# Patient Record
Sex: Female | Born: 1995 | Race: Black or African American | Hispanic: No | Marital: Single | Smoking: Never smoker
Health system: Southern US, Community
[De-identification: ages and names within clinical notes are randomized; demographics above are authoritative.]

## PROBLEM LIST (undated history)

## (undated) ENCOUNTER — Inpatient Hospital Stay (HOSPITAL_COMMUNITY): Payer: Self-pay

## (undated) DIAGNOSIS — Z789 Other specified health status: Secondary | ICD-10-CM

## (undated) HISTORY — PX: NO PAST SURGERIES: SHX2092

---
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
## 2017-06-23 ENCOUNTER — Emergency Department (HOSPITAL_COMMUNITY): Payer: Medicaid Other

## 2017-06-23 ENCOUNTER — Encounter (HOSPITAL_COMMUNITY): Payer: Self-pay | Admitting: Emergency Medicine

## 2017-06-23 ENCOUNTER — Emergency Department (HOSPITAL_COMMUNITY)
Admission: EM | Admit: 2017-06-23 | Discharge: 2017-06-23 | Disposition: A | Discharge status: Home / Self Care | Payer: Medicaid Other | Attending: Emergency Medicine | Admitting: Emergency Medicine

## 2017-06-23 DIAGNOSIS — S99921A Unspecified injury of right foot, initial encounter: Secondary | ICD-10-CM | POA: Diagnosis present

## 2017-06-23 DIAGNOSIS — Y999 Unspecified external cause status: Secondary | ICD-10-CM | POA: Diagnosis not present

## 2017-06-23 DIAGNOSIS — W25XXXA Contact with sharp glass, initial encounter: Secondary | ICD-10-CM | POA: Insufficient documentation

## 2017-06-23 DIAGNOSIS — Y929 Unspecified place or not applicable: Secondary | ICD-10-CM | POA: Insufficient documentation

## 2017-06-23 DIAGNOSIS — Y9301 Activity, walking, marching and hiking: Secondary | ICD-10-CM | POA: Diagnosis not present

## 2017-06-23 DIAGNOSIS — M795 Residual foreign body in soft tissue: Secondary | ICD-10-CM

## 2017-06-23 MED ORDER — LIDOCAINE-EPINEPHRINE (PF) 2 %-1:200000 IJ SOLN
20.0000 mL | Freq: Once | INTRAMUSCULAR | Status: AC
  Administered 2017-06-23: 20 mL
  Filled 2017-06-23: qty 20

## 2017-06-23 MED ORDER — AMOXICILLIN-POT CLAVULANATE 875-125 MG PO TABS: 1.0000 | ORAL_TABLET | Freq: Two times a day (BID) | ORAL | 0 refills | Status: DC

## 2017-06-23 MED ORDER — TETANUS-DIPHTH-ACELL PERTUSSIS 5-2.5-18.5 LF-MCG/0.5 IM SUSP
0.5000 mL | Freq: Once | INTRAMUSCULAR | Status: AC
  Administered 2017-06-23: 0.5 mL via INTRAMUSCULAR
  Filled 2017-06-23: qty 0.5

## 2017-06-23 NOTE — ED Triage Notes (Signed)
Happy meal given to PT with a drink

## 2017-06-23 NOTE — Discharge Instructions (Signed)
Take antibiotics as prescribed. Complete the entire course of antibiotics. You may use Tylenol or ibuprofen as needed for pain. Keep the area covered for 24 hours. After this, you may remove the dressing, wash gently with soap and water, and reapply dressing. Make sure that the cut stays covered and clean until it is all healed. You will have less pain if you wear shoes with thicker soles. Return to the emergency department if you develop fever, chills, purulent drainage, or any new or worsening symptoms.

## 2017-06-23 NOTE — ED Triage Notes (Signed)
Pt reports she stepped on some glass with her right foot this am, unsure where glass came from, unsure of last tetanus shot

## 2017-06-23 NOTE — ED Notes (Signed)
Declined W/C at D/C and was escorted to lobby by RN. 

## 2017-06-23 NOTE — ED Provider Notes (Signed)
[facility]-EMER[facility]ENCY DEPT Provider Note   CSN: [id number] Arrival date & time: 06/23/17  1100     History   Chief Complaint Chief Complaint  Patient presents with  . Foreign Body in Skin    HPI Lisa Davies is a 21 y.o. female presenting with right foot pain.  Patient states that she was walking barefoot around 7 or 8:00 this morning when she stepped on a piece of glass. She tried to remove the glass herself, but was unsuccessful. She reports pain at the base of her right pinky toe, where the glass was inserted, but no pain elsewhere. Touching the area makes the pain worse, nothing makes it better. She is not immunocompromised. She has not taken anything for pain. She has no numbness or tingling. She is not on blood thinners. She does not know when her last tetanus shot was. She has never had a bad reaction to antibiotics, and has not taken any recently.  HPI  History reviewed. No pertinent past medical history.  There are no active problems to display for this patient.   History reviewed. No pertinent surgical history.  OB History    No data available       Home Medications    Prior to Admission medications   Medication Sig Start Date End Date Taking? Authorizing Provider  amoxicillin-clavulanate (AUGMENTIN) 875-125 MG tablet Take 1 tablet by mouth every 12 (twelve) hours. 06/23/17   Davelyn Gwinn, PA-C    Family History No family history on file.  Social History Social History  Substance Use Topics  . Smoking status: Not on file  . Smokeless tobacco: Not on file  . Alcohol use Not on file     Allergies   Patient has no known allergies.   Review of Systems Review of Systems  Skin: Positive for wound.  Neurological: Negative for numbness.  Hematological: Does not bruise/bleed easily.     Physical Exam Updated Vital Signs BP 114/79   Pulse 88   Temp 97.9 F (36.6 C) (Oral)   Resp 12   SpO2 99%   Physical Exam  Constitutional: She is oriented  to person, place, and time. She appears well-developed and well-nourished. No distress.  HENT:  Head: Normocephalic and atraumatic.  Eyes: EOM are normal.  Neck: Normal range of motion.  Pulmonary/Chest: Effort normal.  Abdominal: She exhibits no distension.  Musculoskeletal: Normal range of motion.  Neurological: She is alert and oriented to person, place, and time. She has normal strength. No sensory deficit.  Skin: Skin is warm. Laceration noted.  Laceration at the base of the right pinky toe. Patient with full active range of motion of toes and foot without pain. Sensation intact. No bleeding or drainage from the site. Tenderness to palpation at the base of the pinky toe over the laceration. No lesions noted elsewhere.  Psychiatric: She has a normal mood and affect.  Nursing note and vitals reviewed.    ED Treatments / Results  Labs (all labs ordered are listed, but only abnormal results are displayed) Labs Reviewed - No data to display  EKG  EKG Interpretation None       Radiology Dg Foot Complete Right  Result Date: 06/23/2017 CLINICAL DATA:  Pt c/o plantar right foot pain after stepping on broken glass today. Pt states the glass is still stuck in her foot. No hx of prior injuries or surgeries to the area. EXAM: RIGHT FOOT COMPLETE - 3+ VIEW COMPARISON:  None. FINDINGS: There is no acute fracture  or subluxation. Along the plantar aspect of the fifth metatarsophalangeal joint, there is a 4 x 3 mm radiopaque foreign body compatible with a shard of glass. No soft tissue gas. IMPRESSION: Suspect radiopaque foreign body along the plantar aspect of the fifth metatarsophalangeal joint. Electronically Signed   By: Norva Pavlov M.D.   On: 06/23/2017 14:15    Procedures .Foreign Body Removal Date/Time: 06/23/2017 2:28 PM Performed by: Alveria Apley Authorized by: Alveria Apley  Consent: Verbal consent obtained. Consent given by: patient Intake: Foot. Anesthesia: local  infiltration  Anesthesia: Local Anesthetic: lidocaine 2% with epinephrine Anesthetic total: 6 mL  Sedation: Patient sedated: no Patient restrained: no Complexity: simple 1 objects recovered. Objects recovered: 2 mm x 3 mm piece of glass Post-procedure assessment: foreign body removed Patient tolerance: Patient tolerated the procedure well with no immediate complications Comments: Pt consented to removal of foreign body. Xray obtained for location. Infiltration with 2% lidocaine with epi. 11 blade scalpel used to make a 1 cm incision perpendicular to the piece of glass. Glass removed with forceps. Wound irrigated extensively with Betadine and saline through syringe. Dry sterile dressing applied. Patient tolerated well without medications.   (including critical care time)  Medications Ordered in ED Medications  Tdap (BOOSTRIX) injection 0.5 mL (0.5 mLs Intramuscular Given 06/23/17 1317)  lidocaine-EPINEPHrine (XYLOCAINE W/EPI) 2 %-1:200000 (PF) injection 20 mL (20 mLs Infiltration Given 06/23/17 1327)     Initial Impression / Assessment and Plan / ED Course  I have reviewed the triage vital signs and the nursing notes.  Pertinent labs & imaging results that were available during my care of the patient were reviewed by me and considered in my medical decision making (see chart for details).     Pt presenting with R foot pain where the pt stepped on glass this morning. She was unable to remove it on her own. Xray shows glass at the base of the R pinky toe. Area numbed and glass removed. Wound left open. Pt neurovascularly intact, and able to move toes without difficulty after procedure. Aftercare instructions given. Abx given for open wound. Return precautions given. Pt states she understands and agrees to plan.  Final Clinical Impressions(s) / ED Diagnoses   Final diagnoses:  Foreign body (FB) in soft tissue    New Prescriptions Discharge Medication List as of 06/23/2017  2:22 PM      START taking these medications   Details  amoxicillin-clavulanate (AUGMENTIN) 875-125 MG tablet Take 1 tablet by mouth every 12 (twelve) hours., Starting Sun 06/23/2017, Print         Meranda Dechaine, PA-C 06/23/17 1610    Rolland Porter, MD 07/01/17 [phone]

## 2017-08-08 ENCOUNTER — Inpatient Hospital Stay (HOSPITAL_COMMUNITY): Payer: Medicaid Other

## 2017-08-08 ENCOUNTER — Encounter (HOSPITAL_COMMUNITY): Payer: Self-pay | Admitting: *Deleted

## 2017-08-08 ENCOUNTER — Inpatient Hospital Stay (HOSPITAL_COMMUNITY)
Admission: AD | Admit: 2017-08-08 | Discharge: 2017-08-08 | Disposition: A | Discharge status: Home / Self Care | Payer: Medicaid Other | Source: Ambulatory Visit | Attending: Obstetrics and Gynecology | Admitting: Obstetrics and Gynecology

## 2017-08-08 DIAGNOSIS — O26851 Spotting complicating pregnancy, first trimester: Secondary | ICD-10-CM

## 2017-08-08 DIAGNOSIS — O26891 Other specified pregnancy related conditions, first trimester: Secondary | ICD-10-CM | POA: Insufficient documentation

## 2017-08-08 DIAGNOSIS — R1030 Lower abdominal pain, unspecified: Secondary | ICD-10-CM | POA: Diagnosis present

## 2017-08-08 DIAGNOSIS — O3680X0 Pregnancy with inconclusive fetal viability, not applicable or unspecified: Secondary | ICD-10-CM | POA: Diagnosis not present

## 2017-08-08 DIAGNOSIS — O418X90 Other specified disorders of amniotic fluid and membranes, unspecified trimester, not applicable or unspecified: Secondary | ICD-10-CM | POA: Diagnosis present

## 2017-08-08 DIAGNOSIS — Z3A08 8 weeks gestation of pregnancy: Secondary | ICD-10-CM | POA: Insufficient documentation

## 2017-08-08 DIAGNOSIS — O418X10 Other specified disorders of amniotic fluid and membranes, first trimester, not applicable or unspecified: Secondary | ICD-10-CM

## 2017-08-08 DIAGNOSIS — O208 Other hemorrhage in early pregnancy: Secondary | ICD-10-CM | POA: Diagnosis not present

## 2017-08-08 DIAGNOSIS — O26859 Spotting complicating pregnancy, unspecified trimester: Secondary | ICD-10-CM

## 2017-08-08 DIAGNOSIS — O468X1 Other antepartum hemorrhage, first trimester: Secondary | ICD-10-CM

## 2017-08-08 DIAGNOSIS — O468X9 Other antepartum hemorrhage, unspecified trimester: Secondary | ICD-10-CM

## 2017-08-08 HISTORY — DX: Other specified health status: Z78.9

## 2017-08-08 LAB — WET PREP, GENITAL
CLUE CELLS WET PREP: NONE SEEN
Sperm: NONE SEEN
TRICH WET PREP: NONE SEEN
YEAST WET PREP: NONE SEEN

## 2017-08-08 LAB — ABO/RH: ABO/RH(D): A POS

## 2017-08-08 LAB — URINALYSIS, ROUTINE W REFLEX MICROSCOPIC
BILIRUBIN URINE: NEGATIVE
Glucose, UA: NEGATIVE mg/dL
Hgb urine dipstick: NEGATIVE
KETONES UR: NEGATIVE mg/dL
Nitrite: NEGATIVE
PH: 6 (ref 5.0–8.0)
Protein, ur: NEGATIVE mg/dL
SPECIFIC GRAVITY, URINE: 1.023 (ref 1.005–1.030)

## 2017-08-08 LAB — CBC
HCT: 37.8 % (ref 36.0–46.0)
HEMOGLOBIN: 12.6 g/dL (ref 12.0–15.0)
MCH: 29.7 pg (ref 26.0–34.0)
MCHC: 33.3 g/dL (ref 30.0–36.0)
MCV: 89.2 fL (ref 78.0–100.0)
PLATELETS: 274 10*3/uL (ref 150–400)
RBC: 4.24 MIL/uL (ref 3.87–5.11)
RDW: 13.5 % (ref 11.5–15.5)
WBC: 4.6 10*3/uL (ref 4.0–10.5)

## 2017-08-08 LAB — POCT PREGNANCY, URINE: Preg Test, Ur: POSITIVE — AB

## 2017-08-08 LAB — HCG, QUANTITATIVE, PREGNANCY: HCG, BETA CHAIN, QUANT, S: 20657 m[IU]/mL — AB (ref <5)

## 2017-08-08 MED ORDER — PRENATAL COMPLETE 14-0.4 MG PO TABS: 1.0000 | ORAL_TABLET | Freq: Every day | ORAL | 12 refills | Status: AC

## 2017-08-08 NOTE — MAU Note (Signed)
Pt reports she had a positive home preg test, started having spotting and lower abd pain 4 days ago.

## 2017-08-08 NOTE — Discharge Instructions (Signed)
Return to Maternity Admissions Unit if you start bleeding like a period and/or pain that is not relieved with Tylenol.

## 2017-08-08 NOTE — MAU Provider Note (Signed)
History     CSN: [id number]  Arrival date and time: 08/08/17 [id number]   First Provider Initiated Contact with Patient 08/08/17 1042      Chief Complaint  Patient presents with  . Possible Pregnancy  . Abdominal Pain  . Vaginal Bleeding   HPI  [name] is a 21 y.o. G3P2 at 4874w1d gestation presenting to MAU with complaints of (+) HPT, spotting and lower abd pains x 4 days. She says the pain is more in the lower RT pelvis. She denies N/V/D  Past Medical History:  Diagnosis Date  . Medical history non-contributory     Past Surgical History:  Procedure Laterality Date  . NO PAST SURGERIES      History reviewed. No pertinent family history.  Social History   Tobacco Use  . Smoking status: Never Smoker  . Smokeless tobacco: Never Used  Substance Use Topics  . Alcohol use: No    Frequency: Never  . Drug use: No    Allergies: No Known Allergies  Medications Prior to Admission  Medication Sig Dispense Refill Last Dose  . amoxicillin-clavulanate (AUGMENTIN) 875-125 MG tablet Take 1 tablet by mouth every 12 (twelve) hours. (Patient not taking: Reported on 08/08/2017) 14 tablet 0 Not Taking at Unknown time    Review of Systems Physical Exam   Blood pressure 109/67, pulse 91, temperature 98.4 F (36.9 C), temperature source Oral, resp. rate 16, height 5\' 6"  (1.676 m), weight 126 lb (57.2 kg), last menstrual period 06/12/2017, SpO2 100 %.  Physical Exam  MAU Course  Procedures  MDM CCUA UPT CBC HCG ABO/Rh Wet Prep  GC/CT OB U/S < 14 wks OB Transvaginal U/S  Results for orders placed or performed during the hospital encounter of 08/08/17 (from the past 24 hour(s))  Urinalysis, Routine w reflex microscopic     Status: Abnormal   Collection Time: 08/08/17  9:57 AM  Result Value Ref Range   Color, Urine YELLOW YELLOW   APPearance HAZY (A) CLEAR   Specific Gravity, Urine 1.023 1.005 - 1.030   pH 6.0 5.0 - 8.0   Glucose, UA NEGATIVE NEGATIVE mg/dL   Hgb urine dipstick NEGATIVE NEGATIVE   Bilirubin Urine NEGATIVE NEGATIVE   Ketones, ur NEGATIVE NEGATIVE mg/dL   Protein, ur NEGATIVE NEGATIVE mg/dL   Nitrite NEGATIVE NEGATIVE   Leukocytes, UA TRACE (A) NEGATIVE   RBC / HPF 0-5 0 - 5 RBC/hpf   WBC, UA 0-5 0 - 5 WBC/hpf   Bacteria, UA RARE (A) NONE SEEN   Squamous Epithelial / LPF 0-5 (A) NONE SEEN   Mucus PRESENT   Pregnancy, urine POC     Status: Abnormal   Collection Time: 08/08/17 10:23 AM  Result Value Ref Range   Preg Test, Ur POSITIVE (A) NEGATIVE  CBC     Status: None   Collection Time: 08/08/17 10:42 AM  Result Value Ref Range   WBC 4.6 4.0 - 10.5 K/uL   RBC 4.24 3.87 - 5.11 MIL/uL   Hemoglobin 12.6 12.0 - 15.0 g/dL   HCT 74.237.8 59.536.0 - 63.846.0 %   MCV 89.2 78.0 - 100.0 fL   MCH 29.7 26.0 - 34.0 pg   MCHC 33.3 30.0 - 36.0 g/dL   RDW 75.613.5 43.311.5 - 29.515.5 %   Platelets 274 150 - 400 K/uL  hCG, quantitative, pregnancy     Status: Abnormal   Collection Time: 08/08/17 10:42 AM  Result Value Ref Range   hCG, Beta Chain, Quant, S 20,657 (H) <  5 mIU/mL  ABO/Rh     Status: None (Preliminary result)   Collection Time: 08/08/17 10:42 AM  Result Value Ref Range   ABO/RH(D) A POS   Wet prep, genital     Status: Abnormal   Collection Time: 08/08/17 10:59 AM  Result Value Ref Range   Yeast Wet Prep HPF POC NONE SEEN NONE SEEN   Trich, Wet Prep NONE SEEN NONE SEEN   Clue Cells Wet Prep HPF POC NONE SEEN NONE SEEN   WBC, Wet Prep HPF POC MANY (A) NONE SEEN   Sperm NONE SEEN    [place] Ob Comp Less 14 Wks  Result Date: 08/08/2017 CLINICAL DATA:  Pregnant patient with bleeding and lower abdominal pain for multiple days. EXAM: OBSTETRIC <14 WK US AND TRANSVAGINAL OB US TECHNIQUE: Both transabdominal and transvaginal ultrasound examinations were performed for complete evaluation of the gestation as well as the maternal uterus, adnexal regions, and pelvic cul-de-sac. Transvaginal technique was performed to assess early pregnancy. COMPARISON:  None.  FINDINGS: Intrauterine gestational sac: Single Yolk sac:  Visualized. Embryo:  Not Visualized. Cardiac Activity: Not Visualized. MSD: 11.0  mm   5 w   6  d Subchorionic hemorrhage:  Small Maternal uterus/adnexae: Normal right and left ovaries. Trace fluid in the pelvis. IMPRESSION: Probable early intrauterine gestational sac and yolk sac but no fetal pole or cardiac activity yet visualized. Recommend follow-up quantitative B-HCG levels and follow-up US in 14 days to assess viability. This recommendation follows SRU consensus guidelines: Diagnostic Criteria for Nonviable Pregnancy Early in the First Trimester. Malva Limes Engl J Med 2013; 147:8295-62; 369:1443-51. Electronically Signed   By: Annia Beltrew  Davis M.D.   On: 08/08/2017 13:46   [place] Ob Transvaginal  Result Date: 08/08/2017 CLINICAL DATA:  Pregnant patient with bleeding and lower abdominal pain for multiple days. EXAM: OBSTETRIC <14 WK US AND TRANSVAGINAL OB US TECHNIQUE: Both transabdominal and transvaginal ultrasound examinations were performed for complete evaluation of the gestation as well as the maternal uterus, adnexal regions, and pelvic cul-de-sac. Transvaginal technique was performed to assess early pregnancy. COMPARISON:  None. FINDINGS: Intrauterine gestational sac: Single Yolk sac:  Visualized. Embryo:  Not Visualized. Cardiac Activity: Not Visualized. MSD: 11.0  mm   5 w   6  d Subchorionic hemorrhage:  Small Maternal uterus/adnexae: Normal right and left ovaries. Trace fluid in the pelvis. IMPRESSION: Probable early intrauterine gestational sac and yolk sac but no fetal pole or cardiac activity yet visualized. Recommend follow-up quantitative B-HCG levels and follow-up US in 14 days to assess viability. This recommendation follows SRU consensus guidelines: Diagnostic Criteria for Nonviable Pregnancy Early in the First Trimester. Malva Limes Engl J Med 2013; 130:8657-84; 369:1443-51. Electronically Signed   By: Annia Beltrew  Davis M.D.   On: 08/08/2017 13:46     Assessment and Plan  Pregnancy  with uncertain fetal viability, single or unspecified fetus - - - Plan Viability U/S in 2 wks - F/U with [facility] after U/S  Subchorionic hematoma in first trimester, single or unspecified fetus - [facility] - Advised to return to MAU for BRB and/or abd pain not relieved by Tylenol  Discharge home Patient verbalized an understanding of the plan of care and agrees.    Raelyn Moraolitta Cattleya Dobratz 08/08/2017, 10:42 AM

## 2017-08-09 LAB — GC/CHLAMYDIA PROBE AMP (~~LOC~~) NOT AT ARMC
CHLAMYDIA, DNA PROBE: NEGATIVE
NEISSERIA GONORRHEA: NEGATIVE

## 2017-08-09 LAB — HIV ANTIBODY (ROUTINE TESTING W REFLEX): HIV Screen 4th Generation wRfx: NONREACTIVE

## 2017-08-20 ENCOUNTER — Encounter (HOSPITAL_COMMUNITY): Payer: Self-pay | Admitting: *Deleted

## 2017-08-20 ENCOUNTER — Inpatient Hospital Stay (HOSPITAL_COMMUNITY)
Admission: AD | Admit: 2017-08-20 | Discharge: 2017-08-20 | Disposition: A | Discharge status: Home / Self Care | Payer: Medicaid Other | Source: Ambulatory Visit | Attending: Obstetrics & Gynecology | Admitting: Obstetrics & Gynecology

## 2017-08-20 ENCOUNTER — Inpatient Hospital Stay (HOSPITAL_COMMUNITY): Payer: Medicaid Other

## 2017-08-20 ENCOUNTER — Other Ambulatory Visit: Payer: Self-pay

## 2017-08-20 DIAGNOSIS — R109 Unspecified abdominal pain: Secondary | ICD-10-CM

## 2017-08-20 DIAGNOSIS — O26891 Other specified pregnancy related conditions, first trimester: Secondary | ICD-10-CM | POA: Insufficient documentation

## 2017-08-20 DIAGNOSIS — O418X10 Other specified disorders of amniotic fluid and membranes, first trimester, not applicable or unspecified: Secondary | ICD-10-CM | POA: Diagnosis not present

## 2017-08-20 DIAGNOSIS — O468X1 Other antepartum hemorrhage, first trimester: Secondary | ICD-10-CM | POA: Diagnosis not present

## 2017-08-20 DIAGNOSIS — M549 Dorsalgia, unspecified: Secondary | ICD-10-CM | POA: Insufficient documentation

## 2017-08-20 DIAGNOSIS — R1032 Left lower quadrant pain: Secondary | ICD-10-CM | POA: Insufficient documentation

## 2017-08-20 DIAGNOSIS — O219 Vomiting of pregnancy, unspecified: Secondary | ICD-10-CM | POA: Diagnosis not present

## 2017-08-20 DIAGNOSIS — Z3A01 Less than 8 weeks gestation of pregnancy: Secondary | ICD-10-CM | POA: Diagnosis not present

## 2017-08-20 DIAGNOSIS — Z79899 Other long term (current) drug therapy: Secondary | ICD-10-CM | POA: Diagnosis not present

## 2017-08-20 DIAGNOSIS — O212 Late vomiting of pregnancy: Secondary | ICD-10-CM | POA: Diagnosis not present

## 2017-08-20 DIAGNOSIS — M7918 Myalgia, other site: Secondary | ICD-10-CM | POA: Diagnosis not present

## 2017-08-20 DIAGNOSIS — Z3491 Encounter for supervision of normal pregnancy, unspecified, first trimester: Secondary | ICD-10-CM

## 2017-08-20 LAB — URINALYSIS, ROUTINE W REFLEX MICROSCOPIC
BACTERIA UA: NONE SEEN
Bilirubin Urine: NEGATIVE
Glucose, UA: NEGATIVE mg/dL
Hgb urine dipstick: NEGATIVE
KETONES UR: 20 mg/dL — AB
Leukocytes, UA: NEGATIVE
Nitrite: NEGATIVE
PROTEIN: 100 mg/dL — AB
Specific Gravity, Urine: 1.031 — ABNORMAL HIGH (ref 1.005–1.030)
pH: 5 (ref 5.0–8.0)

## 2017-08-20 MED ORDER — PROMETHAZINE HCL 25 MG PO TABS: 12.5000 mg | ORAL_TABLET | Freq: Every day | ORAL | 2 refills | Status: AC

## 2017-08-20 MED ORDER — METOCLOPRAMIDE HCL 10 MG PO TABS: 10.0000 mg | ORAL_TABLET | Freq: Three times a day (TID) | ORAL | 2 refills | Status: AC

## 2017-08-20 NOTE — MAU Provider Note (Signed)
Chief Complaint: Abdominal Pain and Back Pain   First Provider Initiated Contact with Patient 08/20/17 1148      SUBJECTIVE HPI: Lisa Davies is a 21 y.o. G3P2 at 1027w4d by gestational sac diameter on US 08/08/17, who presents to maternity admissions reporting LLQ abdominal pain that started today. It is sharp, intermittent, increased with walking/position change, and resolved with rest.  She has nausea with occasional vomiting which is unchanged by today's pain.  There are no other associated symptoms. She has not tried any treatments.  Her US on 08/08/17 confirmed an IUP at 392w6d but there was not yet a fetal pole and she had a small subchorionic hemorrhage. She denies bleeding today. She has outpatient US ordered on 08/22/17.  She denies vaginal itching/burning, urinary symptoms, h/a, dizziness, or fever/chills.     HPI  Past Medical History:  Diagnosis Date  . Medical history non-contributory    Past Surgical History:  Procedure Laterality Date  . NO PAST SURGERIES     Social History   Socioeconomic History  . Marital status: Single    Spouse name: Not on file  . Number of children: Not on file  . Years of education: Not on file  . Highest education level: Not on file  Social Needs  . Financial resource strain: Not on file  . Food insecurity - worry: Not on file  . Food insecurity - inability: Not on file  . Transportation needs - medical: Not on file  . Transportation needs - non-medical: Not on file  Occupational History  . Not on file  Tobacco Use  . Smoking status: Never Smoker  . Smokeless tobacco: Never Used  Substance and Sexual Activity  . Alcohol use: No    Frequency: Never  . Drug use: No  . Sexual activity: Yes  Other Topics Concern  . Not on file  Social History Narrative  . Not on file   No current facility-administered medications on file prior to encounter.    Current Outpatient Medications on File Prior to Encounter  Medication Sig Dispense  Refill  . Prenatal Vit-Fe Fumarate-FA (PRENATAL COMPLETE) 14-0.4 MG TABS Take 1 tablet daily by mouth. 30 each 12   No Known Allergies  ROS:  Review of Systems  Constitutional: Negative for chills, fatigue and fever.  Respiratory: Negative for shortness of breath.   Cardiovascular: Negative for chest pain.  Gastrointestinal: Positive for abdominal pain, nausea and vomiting.  Genitourinary: Positive for pelvic pain. Negative for difficulty urinating, dysuria, flank pain, vaginal bleeding, vaginal discharge and vaginal pain.  Musculoskeletal: Negative for back pain.  Neurological: Negative for dizziness and headaches.  Psychiatric/Behavioral: Negative.      I have reviewed patient's Past Medical Hx, Surgical Hx, Family Hx, Social Hx, medications and allergies.   Physical Exam   Patient Vitals for the past 24 hrs:  BP Temp Temp src Pulse Resp SpO2 Weight  08/20/17 1120 116/70 98.4 F (36.9 C) Oral 83 16 100 % 125 lb 4 oz (56.8 kg)   Constitutional: Well-developed, well-nourished female in no acute distress.  Cardiovascular: normal rate Respiratory: normal effort GI: Abd soft, non-tender. Pos BS x 4 MS: Extremities nontender, no edema, normal ROM Neurologic: Alert and oriented x 4.  GU: Neg CVAT.  PELVIC EXAM: Deferred, done on 11/15    LAB RESULTS Results for orders placed or performed during the hospital encounter of 08/20/17 (from the past 24 hour(s))  Urinalysis, Routine w reflex microscopic     Status: Abnormal  Collection Time: 08/20/17 11:25 AM  Result Value Ref Range   Color, Urine YELLOW YELLOW   APPearance HAZY (A) CLEAR   Specific Gravity, Urine 1.031 (H) 1.005 - 1.030   pH 5.0 5.0 - 8.0   Glucose, UA NEGATIVE NEGATIVE mg/dL   Hgb urine dipstick NEGATIVE NEGATIVE   Bilirubin Urine NEGATIVE NEGATIVE   Ketones, ur 20 (A) NEGATIVE mg/dL   Protein, ur 409 (A) NEGATIVE mg/dL   Nitrite NEGATIVE NEGATIVE   Leukocytes, UA NEGATIVE NEGATIVE   RBC / HPF 0-5 0 - 5  RBC/hpf   WBC, UA 0-5 0 - 5 WBC/hpf   Bacteria, UA NONE SEEN NONE SEEN   Squamous Epithelial / LPF 0-5 (A) NONE SEEN   Mucus PRESENT     --/--/A POS (11/15 1042)  IMAGING US Ob Comp Less 14 Wks  Result Date: 08/08/2017 CLINICAL DATA:  Pregnant patient with bleeding and lower abdominal pain for multiple days. EXAM: OBSTETRIC <14 WK [place] AND TRANSVAGINAL OB US TECHNIQUE: Both transabdominal and transvaginal ultrasound examinations were performed for complete evaluation of the gestation as well as the maternal uterus, adnexal regions, and pelvic cul-de-sac. Transvaginal technique was performed to assess early pregnancy. COMPARISON:  None. FINDINGS: Intrauterine gestational sac: Single Yolk sac:  Visualized. Embryo:  Not Visualized. Cardiac Activity: Not Visualized. MSD: 11.0  mm   5 w   6  d Subchorionic hemorrhage:  Small Maternal uterus/adnexae: Normal right and left ovaries. Trace fluid in the pelvis. IMPRESSION: Probable early intrauterine gestational sac and yolk sac but no fetal pole or cardiac activity yet visualized. Recommend follow-up quantitative B-HCG levels and follow-up US in 14 days to assess viability. This recommendation follows SRU consensus guidelines: Diagnostic Criteria for Nonviable Pregnancy Early in the First Trimester. Malva Limes Med 2013; 811:9147-82. Electronically Signed   By: Annia Belt M.D.   On: 08/08/2017 13:46   US Ob Transvaginal  Result Date: 08/20/2017 CLINICAL DATA:  Abdominal pain during first trimester of pregnancy EXAM: TRANSVAGINAL OB ULTRASOUND TECHNIQUE: Transvaginal ultrasound was performed for complete evaluation of the gestation as well as the maternal uterus, adnexal regions, and pelvic cul-de-sac. COMPARISON:  08/08/2017 FINDINGS: Intrauterine gestational sac: Present, single Yolk sac:  Present Embryo:  Present Cardiac Activity: Present Heart Rate: 141 bpm CRL:   8.7  mm   6 w 5 d                  [place] EDC: 04/10/2018 Subchorionic hemorrhage:  Small  subchronic hemorrhage present Maternal uterus/adnexae: Ovaries and adnexa unremarkable. No free fluid. IMPRESSION: Single live intrauterine gestation as above. Small subchronic hemorrhage. Electronically Signed   By: Ulyses Southward M.D.   On: 08/20/2017 12:45   US Ob Transvaginal  Result Date: 08/08/2017 CLINICAL DATA:  Pregnant patient with bleeding and lower abdominal pain for multiple days. EXAM: OBSTETRIC <14 WK [place] AND TRANSVAGINAL OB US TECHNIQUE: Both transabdominal and transvaginal ultrasound examinations were performed for complete evaluation of the gestation as well as the maternal uterus, adnexal regions, and pelvic cul-de-sac. Transvaginal technique was performed to assess early pregnancy. COMPARISON:  None. FINDINGS: Intrauterine gestational sac: Single Yolk sac:  Visualized. Embryo:  Not Visualized. Cardiac Activity: Not Visualized. MSD: 11.0  mm   5 w   6  d Subchorionic hemorrhage:  Small Maternal uterus/adnexae: Normal right and left ovaries. Trace fluid in the pelvis. IMPRESSION: Probable early intrauterine gestational sac and yolk sac but no fetal pole or cardiac activity yet visualized. Recommend follow-up quantitative B-HCG  levels and follow-up US in 14 days to assess viability. This recommendation follows SRU consensus guidelines: Diagnostic Criteria for Nonviable Pregnancy Early in the First Trimester. Malva Limes Engl J Med 2013; 119:1478-29; 369:1443-51. Electronically Signed   By: Annia Beltrew  Davis M.D.   On: 08/08/2017 13:46    MAU Management/MDM: Ordered labs and reviewed results.  IUP with normal growth since previous US.  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  Rest/ice/heat/warm bath/Tylenol.  Pt with nausea making her unable to eat, feeling weak at work.  Rx for Reglan during the day, Phenergan at bedtime.  Standard pregnancy work restrictions letter given. [facility].  Pt discharged with strict first trimester precautions.  ASSESSMENT 1. Normal IUP  (intrauterine pregnancy) on prenatal ultrasound, first trimester   2. Subchorionic hematoma in first trimester, single or unspecified fetus   3. Abdominal pain during pregnancy in first trimester   4. Musculoskeletal pain   5. Nausea and vomiting during pregnancy prior to [redacted] weeks gestation     PLAN Discharge home  Allergies as of 08/20/2017   No Known Allergies     Medication List    TAKE these medications   metoCLOPramide 10 MG tablet Commonly known as:  REGLAN Take 1 tablet (10 mg total) by mouth 3 (three) times daily before meals.   PRENATAL COMPLETE 14-0.4 MG Tabs Take 1 tablet daily by mouth.   promethazine 25 MG tablet Commonly known as:  PHENERGAN Take 0.5-1 tablets (12.5-25 mg total) by mouth at bedtime.      Follow-up Information    [facility]   Specialty:  Obstetrics and Gynecology Why:  The office will call you with an appointment or call the number listed. Return to MAU as needed for emergencies. Contact information: [address] [place] [phone]          Sharen CounterLisa Leftwich-Kirby Certified Nurse-Midwife 08/20/2017  1:37 PM

## 2017-08-20 NOTE — MAU Note (Signed)
Having bad abd pain.  Hurts when she walks, hurts in her back too.  Started today.

## 2017-08-22 ENCOUNTER — Ambulatory Visit (HOSPITAL_COMMUNITY): Admission: RE | Admit: 2017-08-22 | Payer: Medicaid Other | Source: Ambulatory Visit

## 2017-09-13 ENCOUNTER — Encounter (HOSPITAL_COMMUNITY): Payer: Self-pay | Admitting: *Deleted

## 2017-09-13 ENCOUNTER — Inpatient Hospital Stay (HOSPITAL_COMMUNITY)
Admission: AD | Admit: 2017-09-13 | Discharge: 2017-09-13 | Disposition: A | Discharge status: Home / Self Care | Payer: Medicaid Other | Source: Ambulatory Visit | Attending: Obstetrics & Gynecology | Admitting: Obstetrics & Gynecology

## 2017-09-13 DIAGNOSIS — Z3A10 10 weeks gestation of pregnancy: Secondary | ICD-10-CM | POA: Insufficient documentation

## 2017-09-13 DIAGNOSIS — Z79899 Other long term (current) drug therapy: Secondary | ICD-10-CM | POA: Insufficient documentation

## 2017-09-13 DIAGNOSIS — O209 Hemorrhage in early pregnancy, unspecified: Secondary | ICD-10-CM | POA: Insufficient documentation

## 2017-09-13 DIAGNOSIS — O418X10 Other specified disorders of amniotic fluid and membranes, first trimester, not applicable or unspecified: Secondary | ICD-10-CM

## 2017-09-13 DIAGNOSIS — O468X1 Other antepartum hemorrhage, first trimester: Secondary | ICD-10-CM

## 2017-09-13 DIAGNOSIS — Z3491 Encounter for supervision of normal pregnancy, unspecified, first trimester: Secondary | ICD-10-CM

## 2017-09-13 LAB — URINALYSIS, ROUTINE W REFLEX MICROSCOPIC
BACTERIA UA: NONE SEEN
BILIRUBIN URINE: NEGATIVE
Glucose, UA: NEGATIVE mg/dL
Ketones, ur: NEGATIVE mg/dL
Leukocytes, UA: NEGATIVE
NITRITE: NEGATIVE
PROTEIN: NEGATIVE mg/dL
SPECIFIC GRAVITY, URINE: 1.005 (ref 1.005–1.030)
pH: 7 (ref 5.0–8.0)

## 2017-09-13 NOTE — MAU Provider Note (Signed)
History     CSN: [id number]  Arrival date and time: 09/13/17 2141   First Provider Initiated Contact with Patient 09/13/17 2232      Chief Complaint  Patient presents with  . Vaginal Bleeding   HPI Lisa Davies is a 21 y.o. G3P2002 at 3129w2d who presents with vaginal bleeding. States went to bathroom earlier tonight & saw a large amount of bright red blood in the toilet. Bleeding has not continued. Denies abdominal pain. No recent intercourse. Diagnosed with a subchorionic hemorrhage earlier in the pregnancy.  OB History    Gravida Para Term Preterm AB Living   3 2 2     2    SAB TAB Ectopic Multiple Live Births           2      Past Medical History:  Diagnosis Date  . Medical history non-contributory     Past Surgical History:  Procedure Laterality Date  . NO PAST SURGERIES      History reviewed. No pertinent family history.  Social History   Tobacco Use  . Smoking status: Never Smoker  . Smokeless tobacco: Never Used  Substance Use Topics  . Alcohol use: No    Frequency: Never  . Drug use: No    Allergies: No Known Allergies  Medications Prior to Admission  Medication Sig Dispense Refill Last Dose  . metoCLOPramide (REGLAN) 10 MG tablet Take 1 tablet (10 mg total) by mouth 3 (three) times daily before meals. 90 tablet 2   . Prenatal Vit-Fe Fumarate-FA (PRENATAL COMPLETE) 14-0.4 MG TABS Take 1 tablet daily by mouth. 30 each 12 08/20/2017 at Unknown time  . promethazine (PHENERGAN) 25 MG tablet Take 0.5-1 tablets (12.5-25 mg total) by mouth at bedtime. 30 tablet 2     Review of Systems  Constitutional: Negative.   Gastrointestinal: Negative.   Genitourinary: Positive for vaginal bleeding.   Physical Exam   Blood pressure 119/68, pulse 84, temperature 98.3 F (36.8 C), resp. rate 18, height 5\' 6"  (1.676 m), last menstrual period 06/12/2017.  Physical Exam  Nursing note and vitals reviewed. Constitutional: She is oriented to person, place, and  time. She appears well-developed and well-nourished. No distress.  HENT:  Head: Normocephalic and atraumatic.  Eyes: Conjunctivae are normal. Right eye exhibits no discharge. Left eye exhibits no discharge. No scleral icterus.  Neck: Normal range of motion.  Respiratory: Effort normal. No respiratory distress.  GI: Soft. There is no tenderness.  Genitourinary:  Genitourinary Comments: No blood. Cervix closed.   Neurological: She is alert and oriented to person, place, and time.  Skin: Skin is warm and dry. She is not diaphoretic.  Psychiatric: She has a normal mood and affect. Her behavior is normal. Judgment and thought content normal.    MAU Course  Procedures Results for orders placed or performed during the hospital encounter of 09/13/17 (from the past 24 hour(s))  Urinalysis, Routine w reflex microscopic     Status: Abnormal   Collection Time: 09/13/17 10:20 PM  Result Value Ref Range   Color, Urine STRAW (A) YELLOW   APPearance CLEAR CLEAR   Specific Gravity, Urine 1.005 1.005 - 1.030   pH 7.0 5.0 - 8.0   Glucose, UA NEGATIVE NEGATIVE mg/dL   Hgb urine dipstick MODERATE (A) NEGATIVE   Bilirubin Urine NEGATIVE NEGATIVE   Ketones, ur NEGATIVE NEGATIVE mg/dL   Protein, ur NEGATIVE NEGATIVE mg/dL   Nitrite NEGATIVE NEGATIVE   Leukocytes, UA NEGATIVE NEGATIVE   RBC / HPF  0-5 0 - 5 RBC/hpf   WBC, UA 0-5 0 - 5 WBC/hpf   Bacteria, UA NONE SEEN NONE SEEN   Squamous Epithelial / LPF 0-5 (A) NONE SEEN    MDM A positive Informal bedside ultrasound confirms IUP w/cardiac activity Cervix closed. No blood on exam [facility]  Assessment and Plan  A: 1. Vaginal bleeding in pregnancy, first trimester   2. Subchorionic hematoma in first trimester, single or unspecified fetus   3. Fetal heart tones present, first trimester    P: Discharge home Pelvic rest Bleeding precautions F/u with OB as scheduled  Judeth Hornrin Abner Ardis 09/13/2017, 10:32 PM

## 2017-09-13 NOTE — MAU Note (Signed)
Pt stated when she went to the bathroom earlier today a lot of blood came ut. Does not think any tissue or clots came ut. Denies any pain or cramping at this time and no bleeding now when she went to the BR.

## 2017-09-13 NOTE — Discharge Instructions (Signed)
Subchorionic Hematoma °A subchorionic hematoma is a gathering of blood between the outer wall of the placenta and the inner wall of the womb (uterus). The placenta is the organ that connects the fetus to the wall of the uterus. The placenta performs the feeding, breathing (oxygen to the fetus), and waste removal (excretory work) of the fetus. °Subchorionic hematoma is the most common abnormality found on a result from ultrasonography done during the first trimester or early second trimester of pregnancy. If there has been little or no vaginal bleeding, early small hematomas usually shrink on their own and do not affect your baby or pregnancy. The blood is gradually absorbed over 1-2 weeks. When bleeding starts later in pregnancy or the hematoma is larger or occurs in an older pregnant woman, the outcome may not be as good. Larger hematomas may get bigger, which increases the chances for miscarriage. Subchorionic hematoma also increases the risk of premature detachment of the placenta from the uterus, preterm (premature) labor, and stillbirth. °Follow these instructions at home: °· Stay on bed rest if your health care provider recommends this. Although bed rest will not prevent more bleeding or prevent a miscarriage, your health care provider may recommend bed rest until you are advised otherwise. °· Avoid heavy lifting (more than 10 lb [4.5 kg]), exercise, sexual intercourse, or douching as directed by your health care provider. °· Keep track of the number of pads you use each day and how soaked (saturated) they are. Write down this information. °· Do not use tampons. °· Keep all follow-up appointments as directed by your health care provider. Your health care provider may ask you to have follow-up blood tests or ultrasound tests or both. °Get help right away if: °· You have severe cramps in your stomach, back, abdomen, or pelvis. °· You have a fever. °· You pass large clots or tissue. Save any tissue for your  health care provider to look at. °· Your bleeding increases or you become lightheaded, feel weak, or have fainting episodes. °This information is not intended to replace advice given to you by your health care provider. Make sure you discuss any questions you have with your health care provider. °Document Released: 12/26/2006 Document Revised: 02/16/2016 Document Reviewed: 04/09/2013 °Elsevier Interactive Patient Education © 2017 [employer] ° °

## 2017-10-03 ENCOUNTER — Encounter: Payer: Medicaid Other | Admitting: Certified Nurse Midwife

## 2018-07-03 ENCOUNTER — Encounter (HOSPITAL_COMMUNITY): Payer: Self-pay

## 2018-08-19 IMAGING — CR DG FOOT COMPLETE 3+V*R*
3 series · 3 of 3 positions shown · non-contrast
Comparison: None.

CLINICAL DATA: Pt c/o plantar right foot pain after stepping on
broken glass today. Pt states the glass is still stuck in her foot.
No hx of prior injuries or surgeries to the area.

EXAM:
RIGHT FOOT COMPLETE - 3+ VIEW

[foot ap]
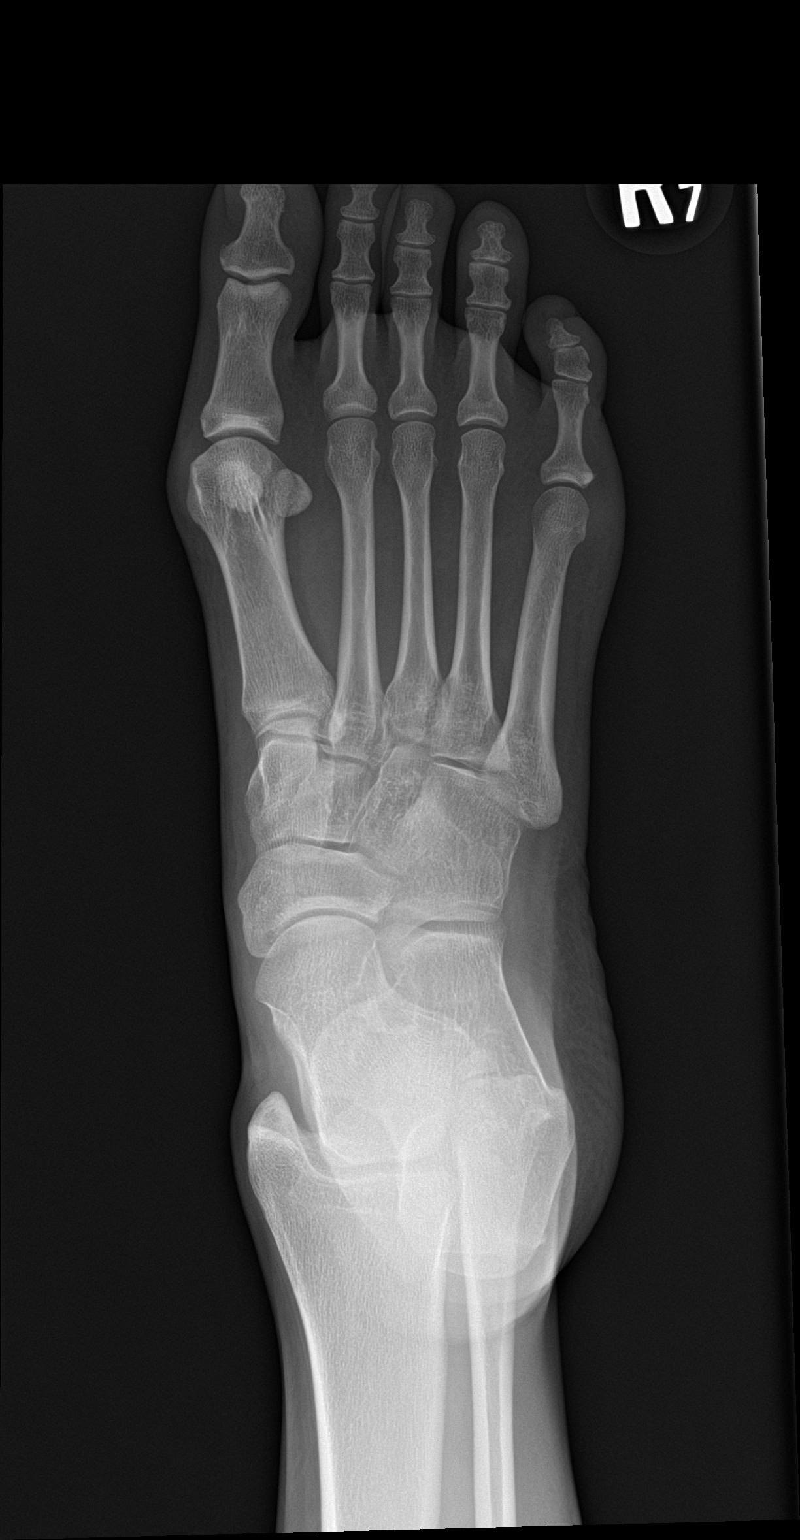

[foot obl]
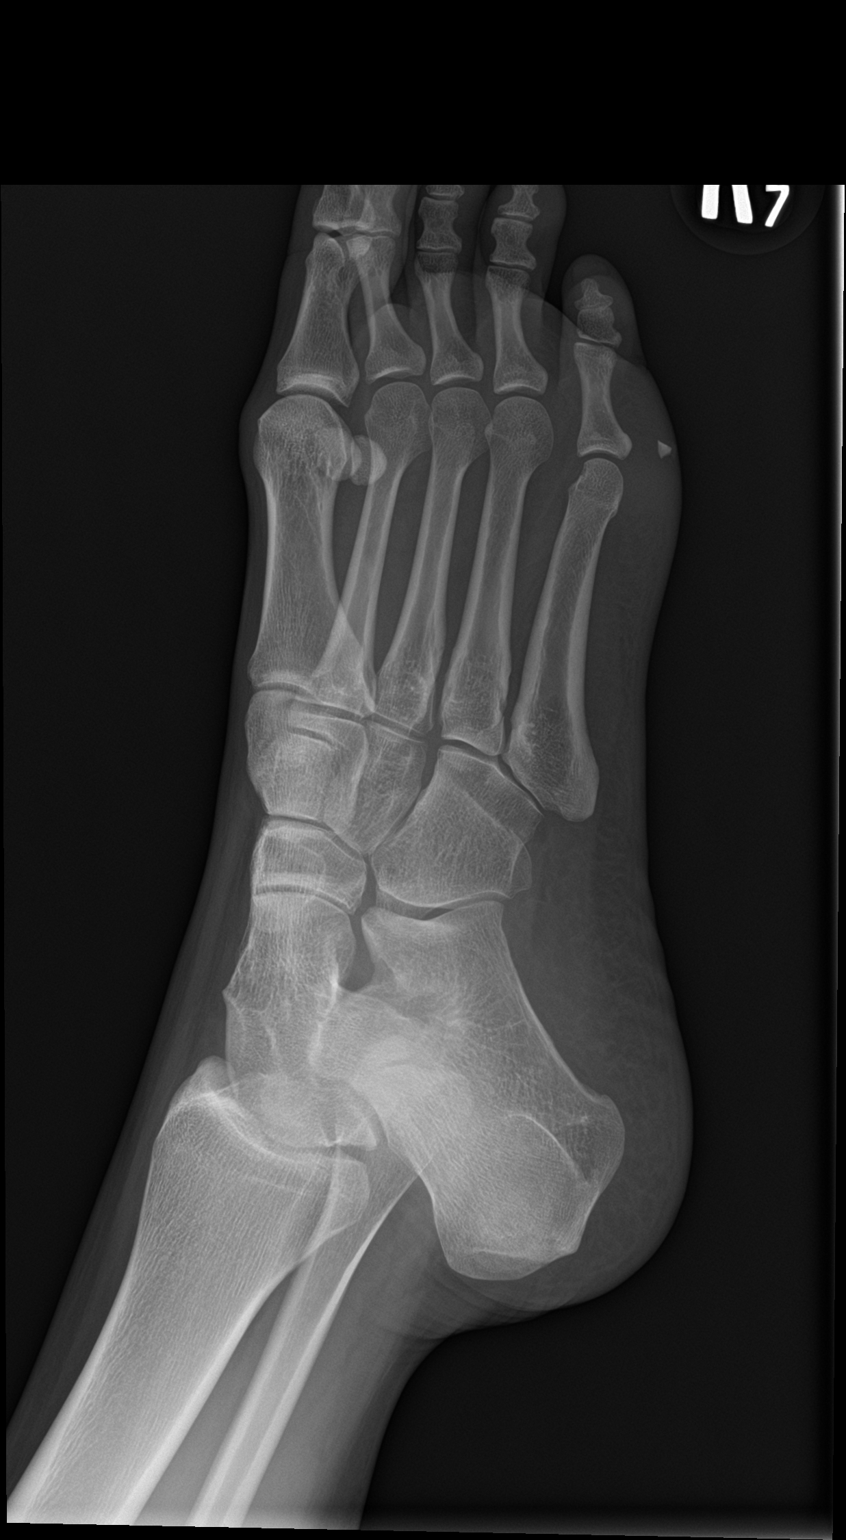

[foot lat]
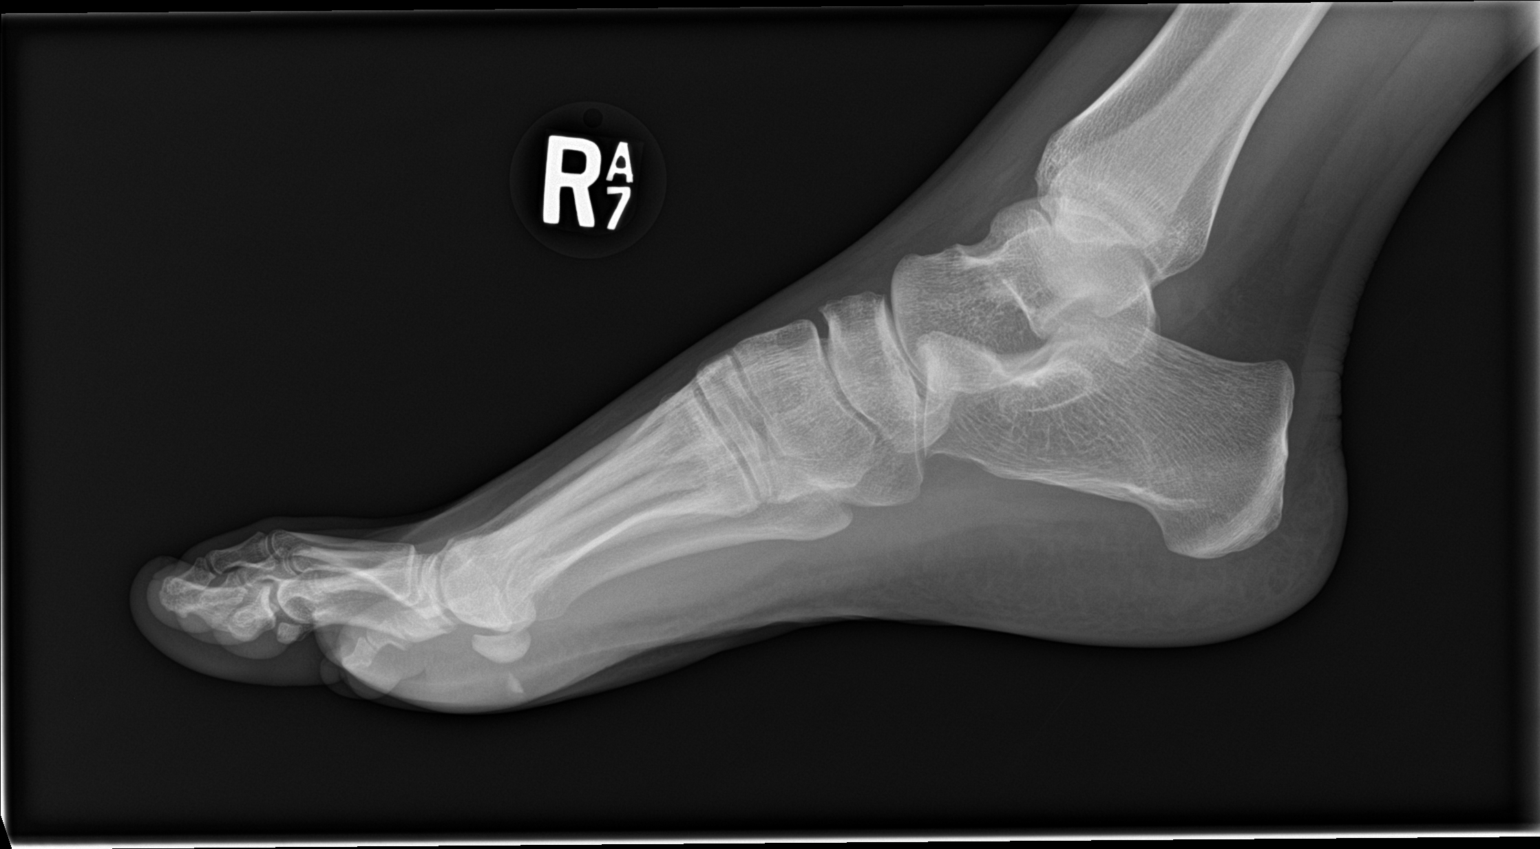

[3 of 3 positions shown; findings below may reference images not displayed]

FINDINGS: There is no acute fracture or subluxation. Along the plantar aspect
of the fifth metatarsophalangeal joint, there is a 4 x 3 mm
radiopaque foreign body compatible with a shard of glass. No soft
tissue gas.
IMPRESSION: Suspect radiopaque foreign body along the plantar aspect of the
fifth metatarsophalangeal joint.

## 2018-10-04 IMAGING — US US OB COMP LESS 14 WK
1 series · 15 of 28 positions shown · non-contrast
Comparison: None.

CLINICAL DATA: Pregnant patient with bleeding and lower abdominal
pain for multiple days.

EXAM:
OBSTETRIC <14 WK US AND TRANSVAGINAL OB US
TECHNIQUE: Both transabdominal and transvaginal ultrasound examinations were
performed for complete evaluation of the gestation as well as the
maternal uterus, adnexal regions, and pelvic cul-de-sac.
Transvaginal technique was performed to assess early pregnancy.

[Series 1: us ob comp less 14 wk · 15 of 65 slices shown]
[im 1/65]
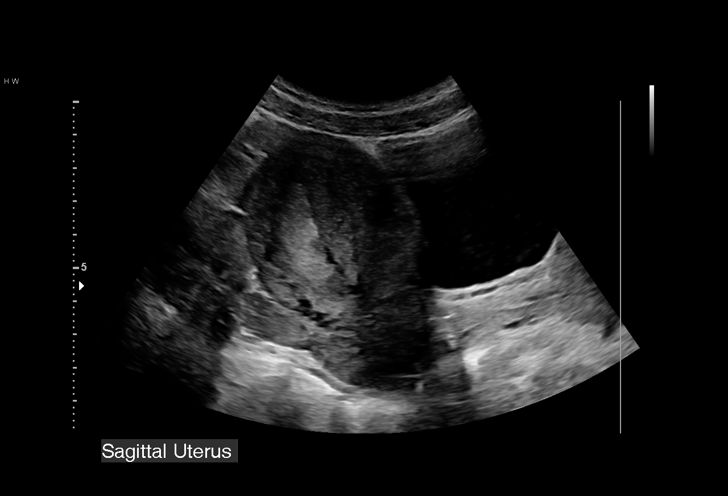
[im 5/65]
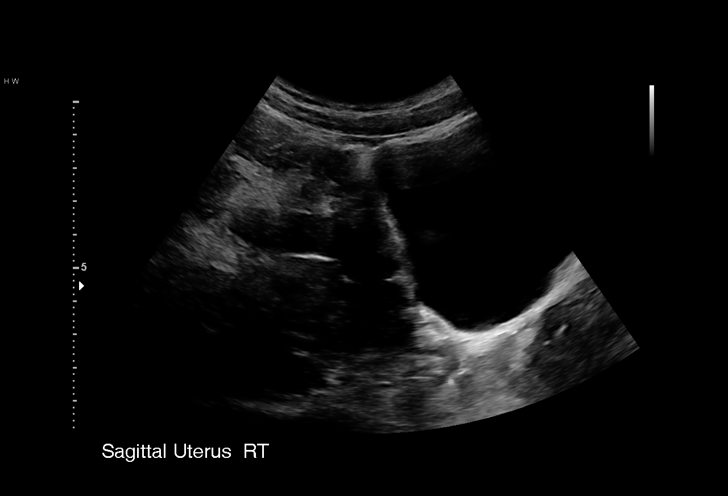
[im 10/65]
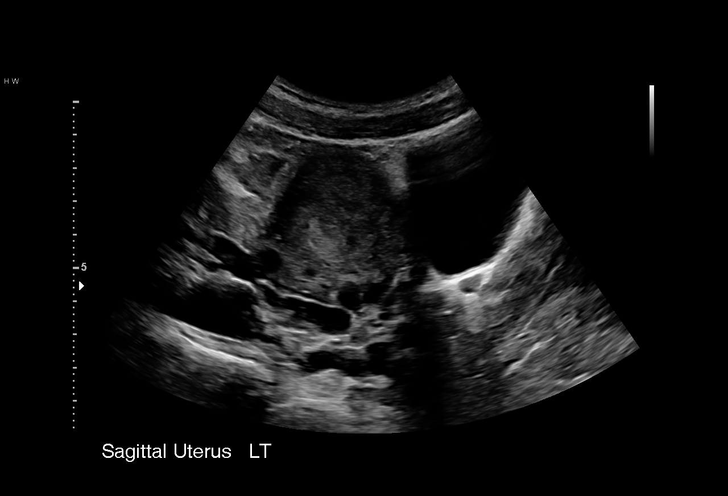
[im 15/65]
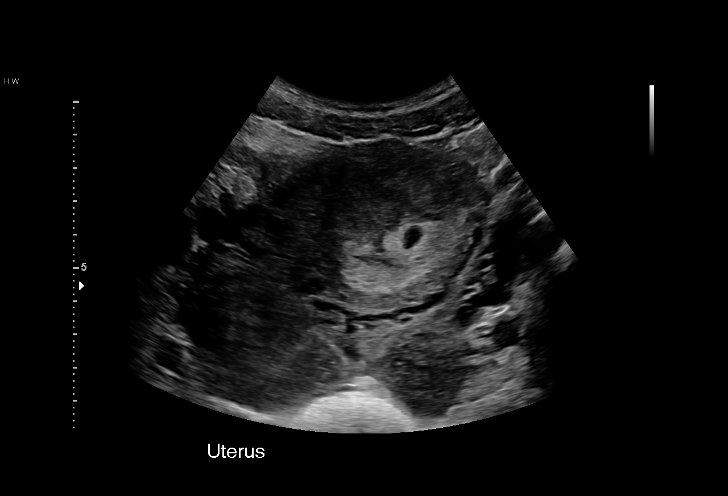
[im 19/65]
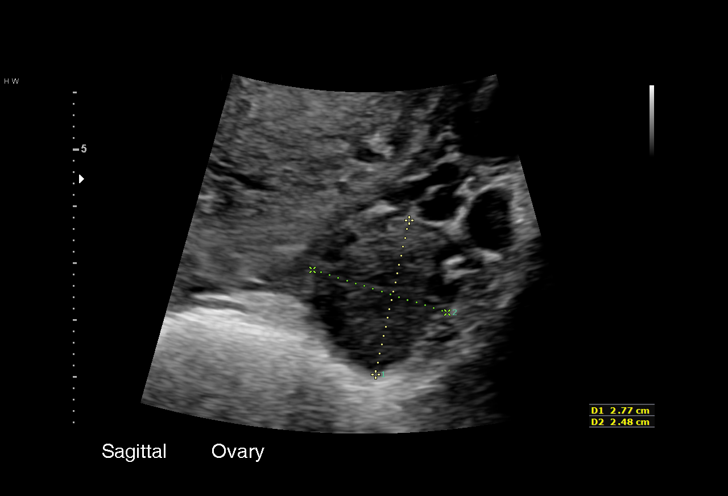
[im 24/65]
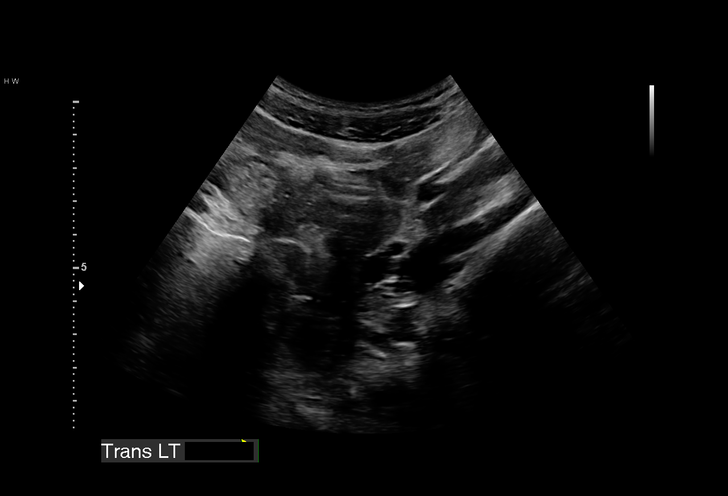
[im 29/65]
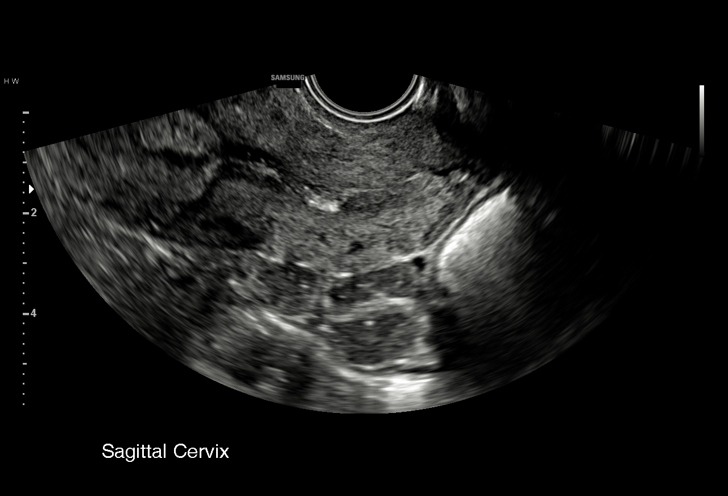
[im 34/65]
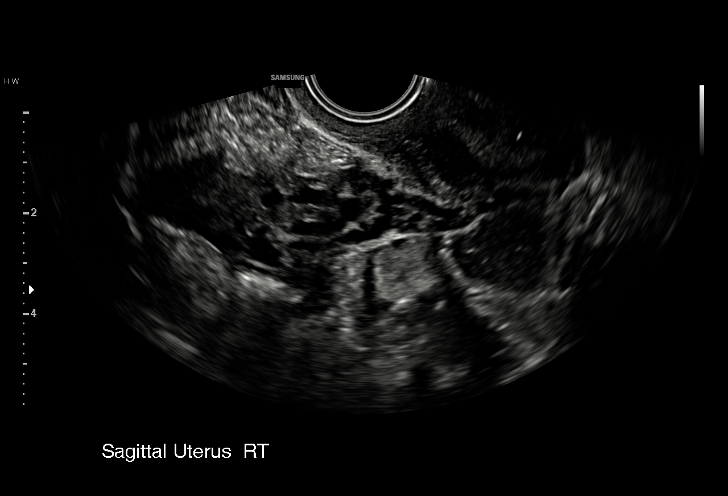
[im 36/65]
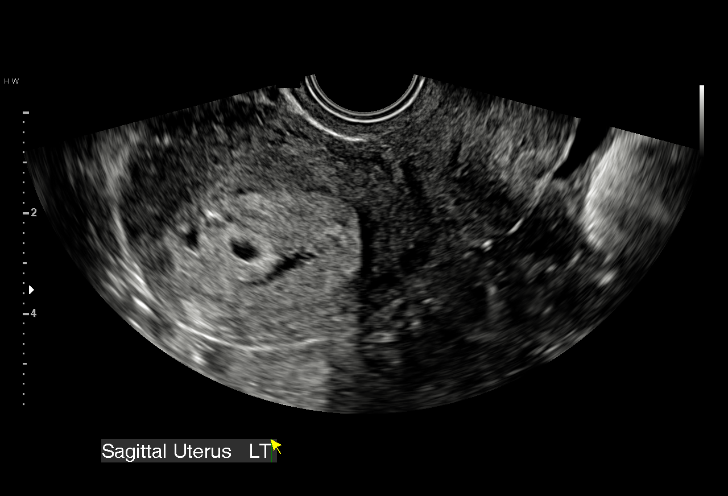
[im 41/65]
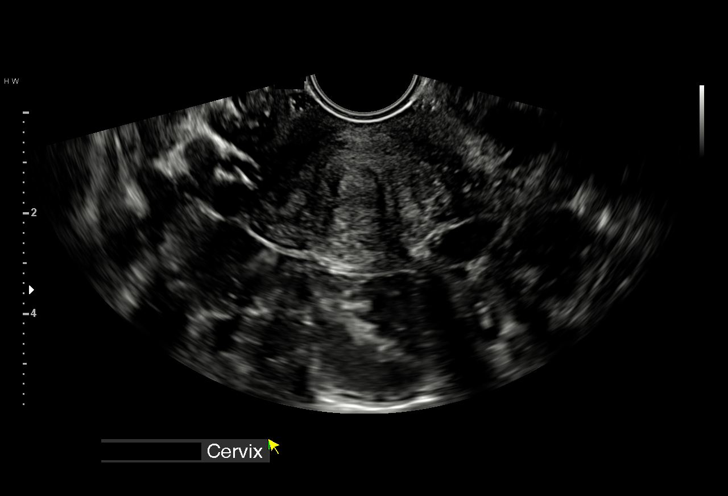
[im 46/65]
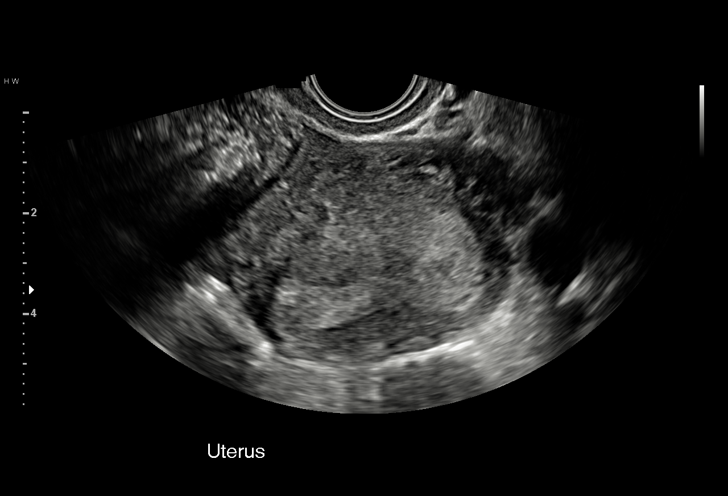
[im 50/65]
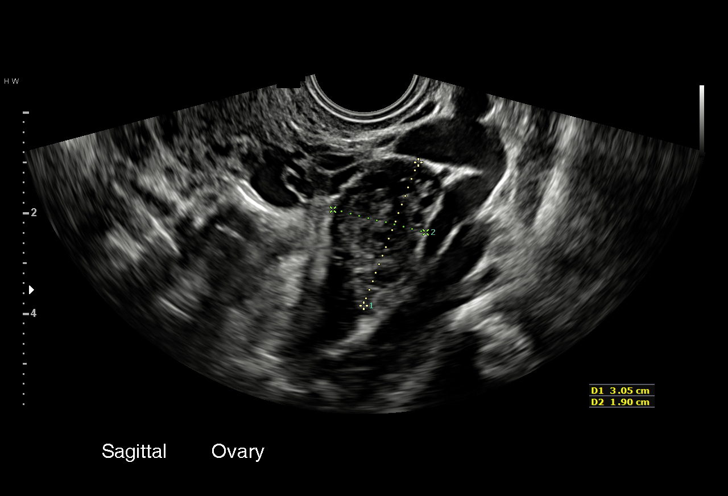
[im 55/65]
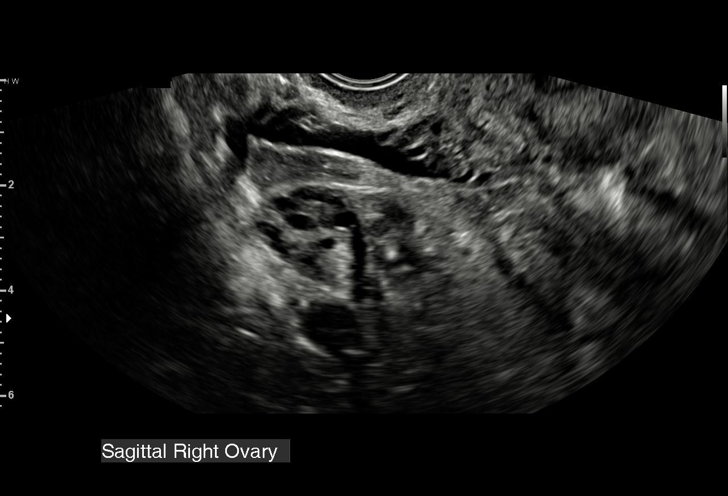
[im 60/65]
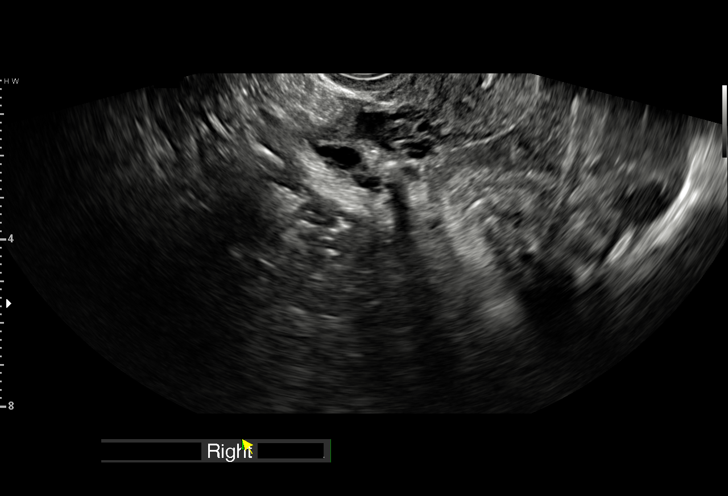
[im 65/65]
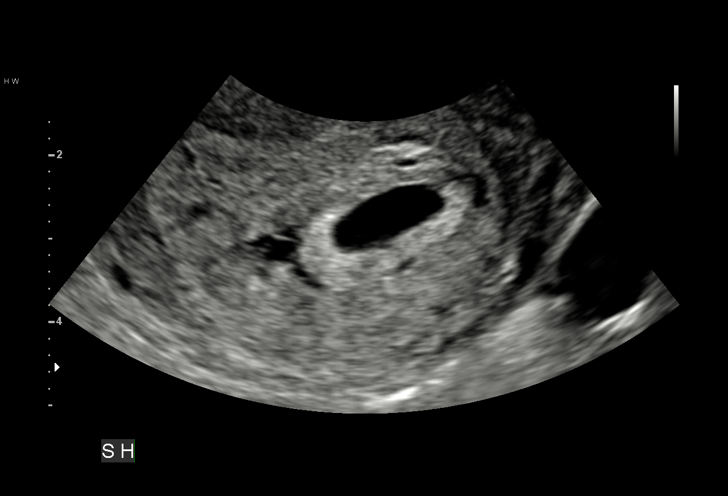

[15 of 28 positions shown; findings below may reference images not displayed]

FINDINGS: Intrauterine gestational sac: Single

Yolk sac:  Visualized.

Embryo:  Not Visualized.

Cardiac Activity: Not Visualized.

MSD: 11.0  mm   5 w   6  d

Subchorionic hemorrhage:  Small

Maternal uterus/adnexae: Normal right and left ovaries. Trace fluid
in the pelvis.
IMPRESSION: Probable early intrauterine gestational sac and yolk sac but no
fetal pole or cardiac activity yet visualized. Recommend follow-up
quantitative B-HCG levels and follow-up US in 14 days to assess
viability. This recommendation follows SRU consensus guidelines:
Diagnostic Criteria for Nonviable Pregnancy Early in the First
Trimester. N Engl J Med 2104; [DATE].

## 2018-10-16 IMAGING — US US OB TRANSVAGINAL
1 series · 15 of 28 positions shown · non-contrast
Comparison: 08/08/2017

CLINICAL DATA: Abdominal pain during first trimester of pregnancy

EXAM:
TRANSVAGINAL OB ULTRASOUND
TECHNIQUE: Transvaginal ultrasound was performed for complete evaluation of the
gestation as well as the maternal uterus, adnexal regions, and
pelvic cul-de-sac.

[Series 1: us ob transvaginal · 76 acquisitions, 15 frames shown]
[im 1/76]
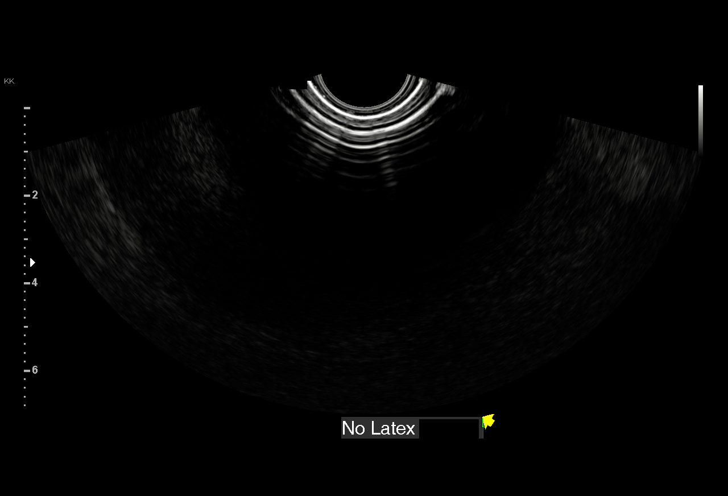
[im 6/76]
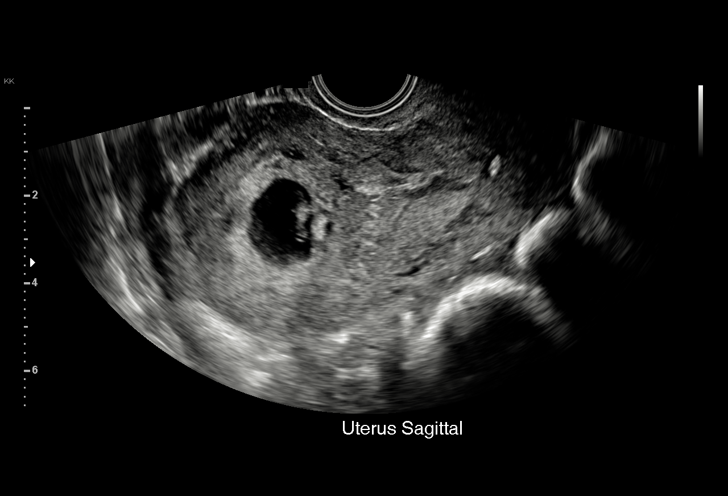
[im 12/76]
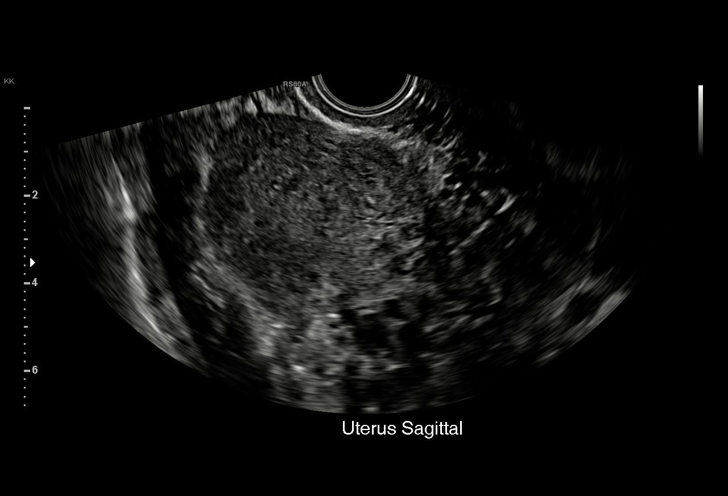
[im 17/76]
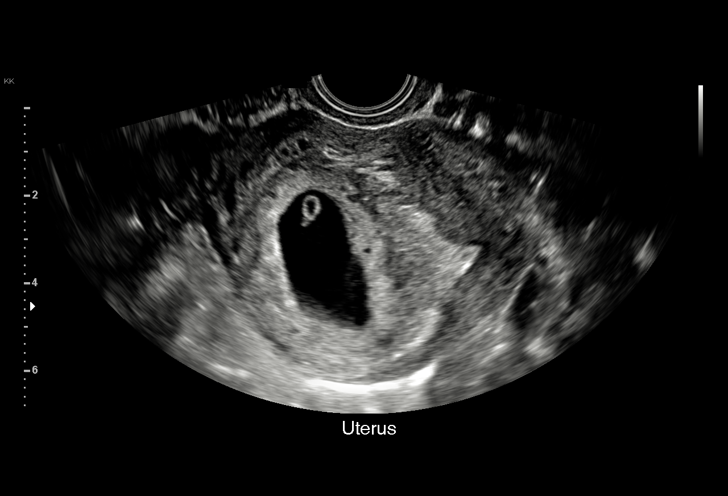
[im 23/76]
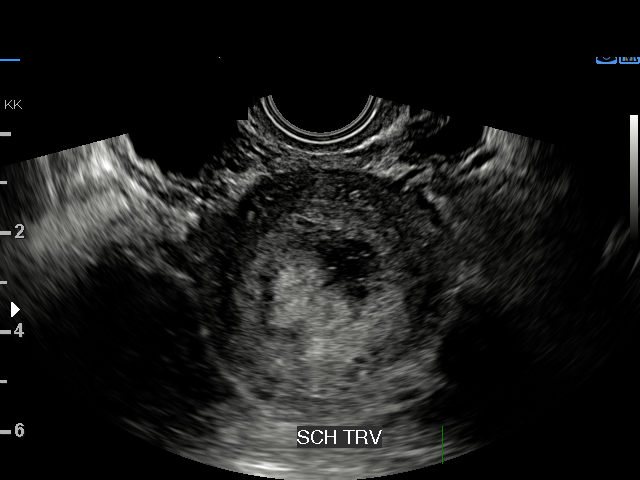
[im 28/76]
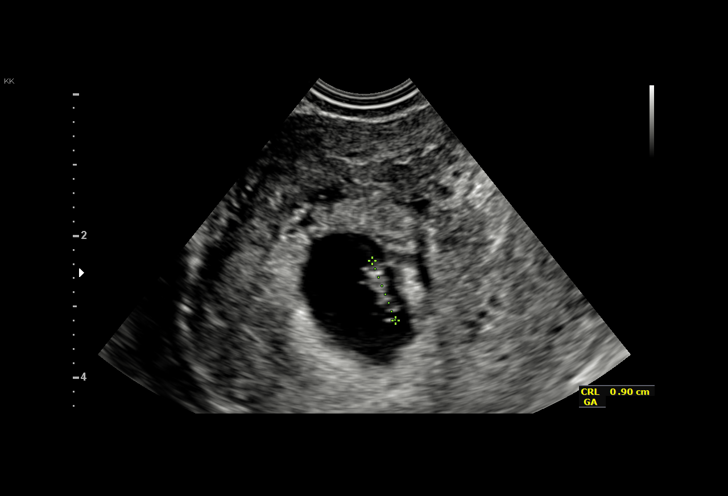
[im 34/76]
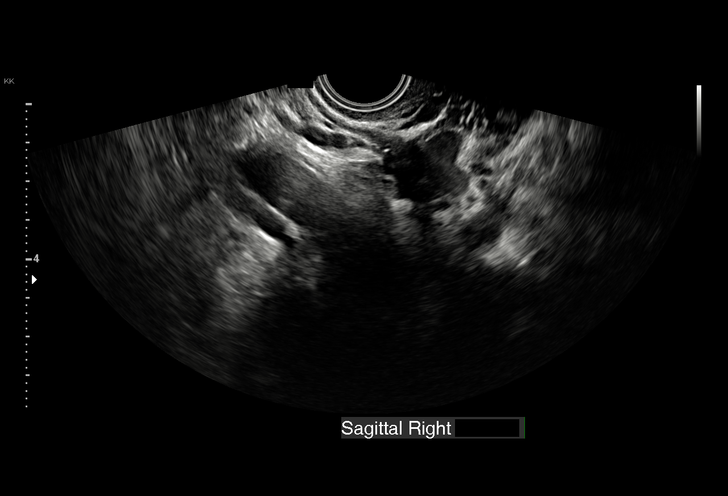
[im 39/76]
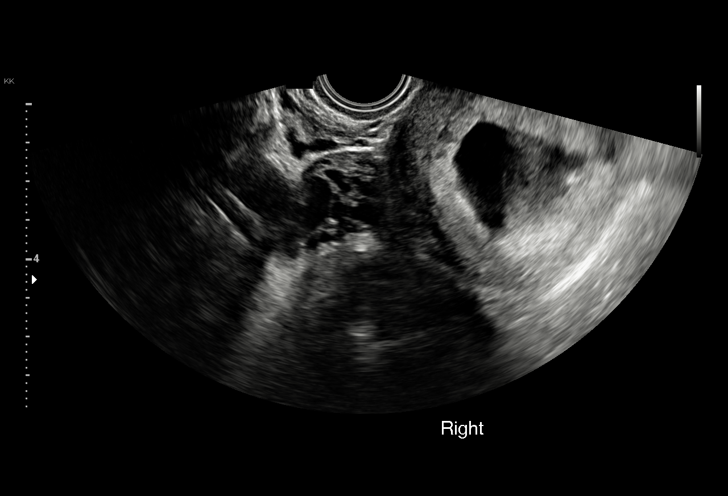
[im 42/76]
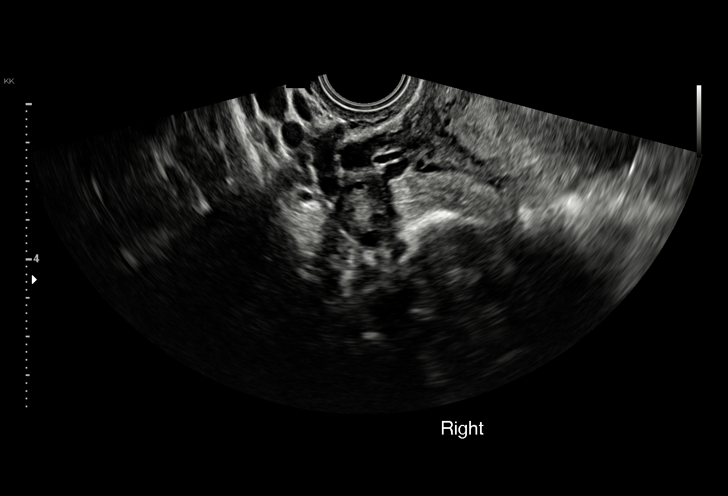
[im 48/76]
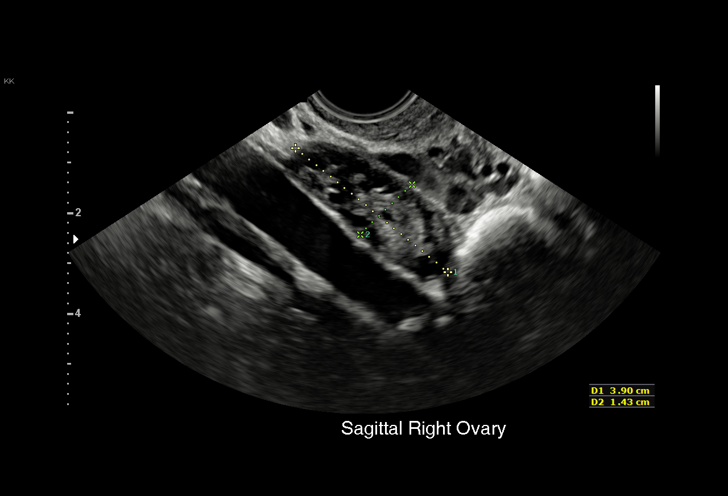
[im 53/76]
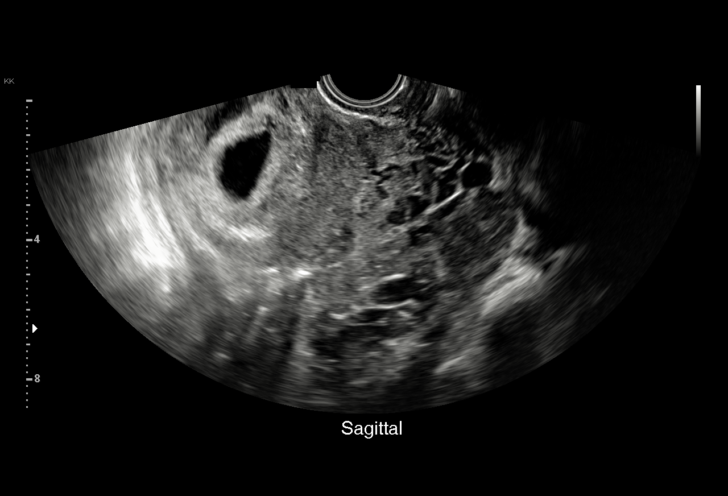
[im 59/76]
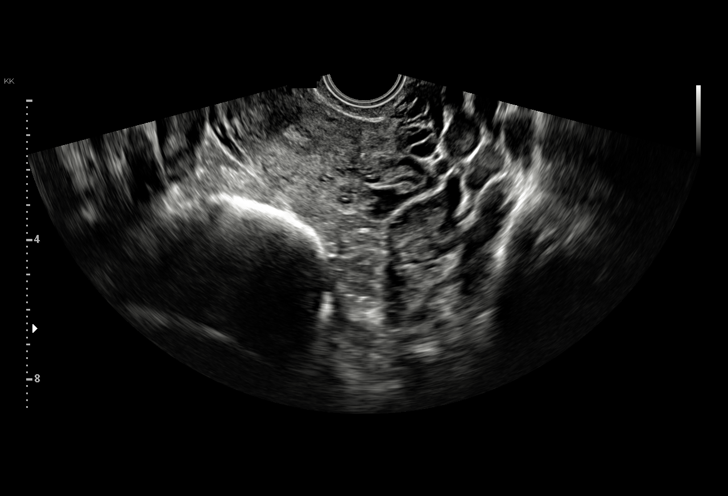
[im 64/76]
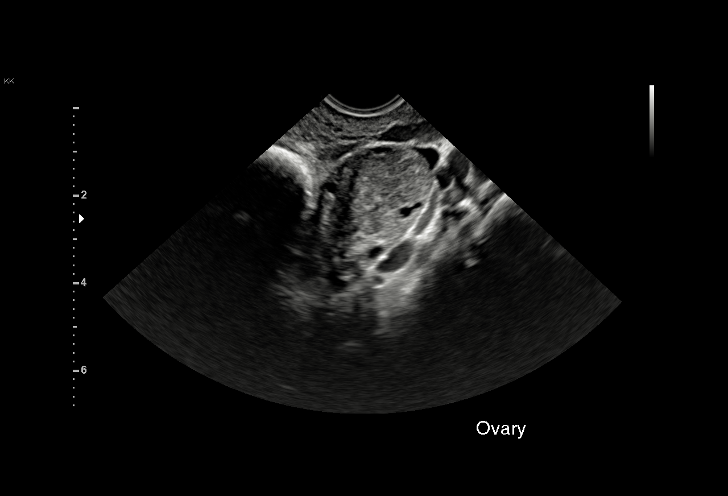
[im 70/76]
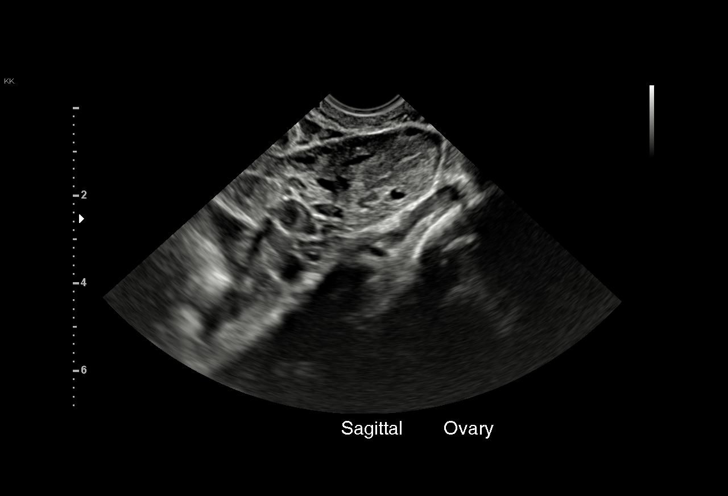
[im 76/76]
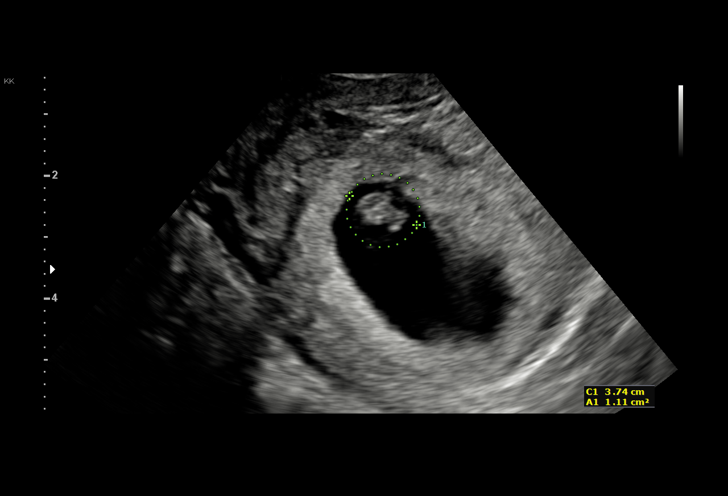

[15 of 28 positions shown; findings below may reference images not displayed]

FINDINGS: Intrauterine gestational sac: Present, single

Yolk sac:  Present

Embryo:  Present

Cardiac Activity: Present

Heart Rate: 141 bpm

CRL:   8.7  mm   6 w 5 d                  US EDC: 04/10/2018

Subchorionic hemorrhage:  Small subchronic hemorrhage present

Maternal uterus/adnexae:

Ovaries and adnexa unremarkable.

No free fluid.
IMPRESSION: Single live intrauterine gestation as above.

Small subchronic hemorrhage.
